# Patient Record
Sex: Female | Born: 1955 | Race: White | Hispanic: No | Marital: Married
Health system: Southern US, Community
[De-identification: ages and names within clinical notes are randomized; demographics above are authoritative.]

## PROBLEM LIST (undated history)

## (undated) DIAGNOSIS — K08409 Partial loss of teeth, unspecified cause, unspecified class: Secondary | ICD-10-CM

---

## 2006-06-12 ENCOUNTER — Other Ambulatory Visit: Admission: RE | Admit: 2006-06-12 | Discharge: 2006-06-12 | Payer: Self-pay | Admitting: *Deleted

## 2007-06-23 ENCOUNTER — Other Ambulatory Visit: Admission: RE | Admit: 2007-06-23 | Discharge: 2007-06-23 | Payer: Self-pay | Admitting: *Deleted

## 2008-06-11 ENCOUNTER — Encounter: Payer: Self-pay | Admitting: Pulmonary Disease

## 2008-06-21 ENCOUNTER — Ambulatory Visit: Payer: Self-pay | Admitting: Pulmonary Disease

## 2008-06-21 DIAGNOSIS — R05 Cough: Secondary | ICD-10-CM

## 2008-06-21 DIAGNOSIS — R059 Cough, unspecified: Secondary | ICD-10-CM | POA: Insufficient documentation

## 2008-06-21 DIAGNOSIS — R498 Other voice and resonance disorders: Secondary | ICD-10-CM

## 2008-06-22 ENCOUNTER — Other Ambulatory Visit: Admission: RE | Admit: 2008-06-22 | Discharge: 2008-06-22 | Payer: Self-pay | Admitting: Gynecology

## 2008-07-09 ENCOUNTER — Ambulatory Visit: Payer: Self-pay | Admitting: Pulmonary Disease

## 2009-01-18 ENCOUNTER — Ambulatory Visit: Payer: Self-pay | Admitting: Licensed Clinical Social Worker

## 2009-01-25 ENCOUNTER — Ambulatory Visit: Payer: Self-pay | Admitting: Licensed Clinical Social Worker

## 2009-02-01 ENCOUNTER — Ambulatory Visit: Payer: Self-pay | Admitting: Licensed Clinical Social Worker

## 2009-02-07 ENCOUNTER — Ambulatory Visit: Payer: Self-pay | Admitting: Licensed Clinical Social Worker

## 2009-02-10 ENCOUNTER — Ambulatory Visit: Payer: Self-pay | Admitting: Licensed Clinical Social Worker

## 2009-02-14 ENCOUNTER — Ambulatory Visit: Payer: Self-pay | Admitting: Licensed Clinical Social Worker

## 2009-03-07 ENCOUNTER — Ambulatory Visit: Payer: Self-pay | Admitting: Licensed Clinical Social Worker

## 2009-04-11 ENCOUNTER — Ambulatory Visit: Payer: Self-pay | Admitting: Licensed Clinical Social Worker

## 2009-10-06 ENCOUNTER — Telehealth: Payer: Self-pay | Admitting: Pulmonary Disease

## 2009-10-14 ENCOUNTER — Ambulatory Visit: Payer: Self-pay | Admitting: Pulmonary Disease

## 2009-10-20 ENCOUNTER — Telehealth (INDEPENDENT_AMBULATORY_CARE_PROVIDER_SITE_OTHER): Payer: Self-pay | Admitting: *Deleted

## 2009-11-28 IMAGING — CT CT ABD-PELV W/O CM
3 of 8 series · 12 of 42 positions shown, 18 images · IV contrast (CONTRAST)
Comparison: NONE

CLINICAL DATA: Midabdominal pain.  Evaluate for diverticulitis. 

CT ABDOMEN AND PELVIS WITHOUT AND WITH INTRAVENOUS AND FOLLOWING 
ORAL  CONTRAST
TECHNIQUE: Multiple axial images were obtained from the diaphragm 
through the pelvis.

[Series 4: venous · axial · portal-venous · 0.77mm/px · z∈[+1201,+1486]mm · 5 of 87 slices shown, 10 images]
[im 15/87  soft-tissue]
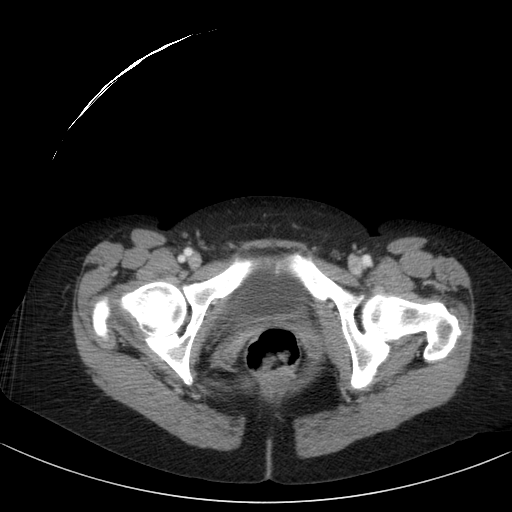
[im 15/87  bone]
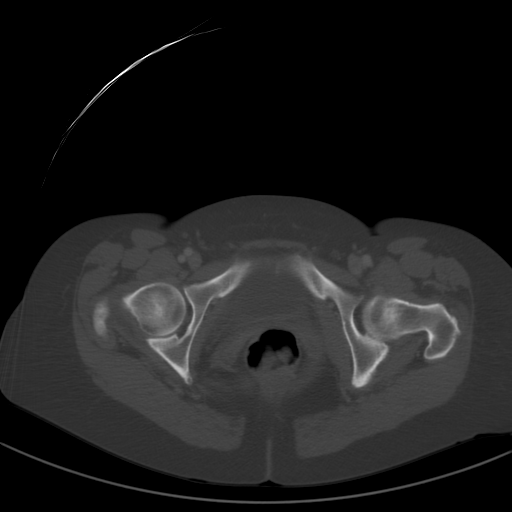
[im 29/87  soft-tissue]
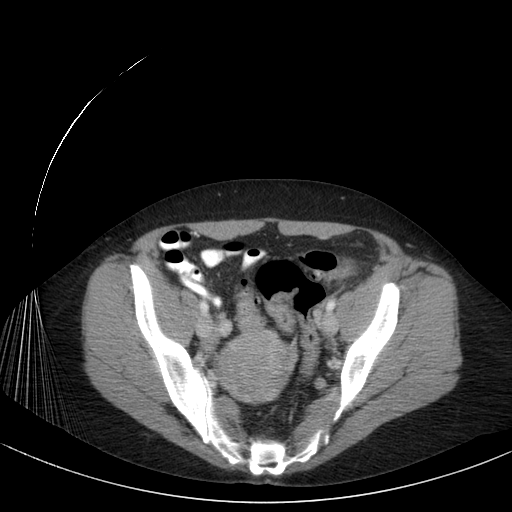
[im 29/87  lung]
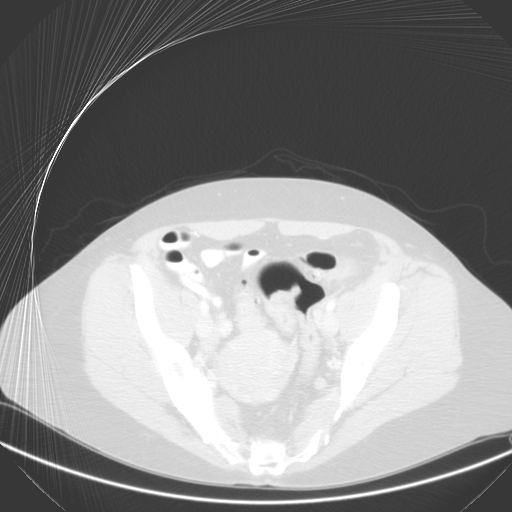
[im 44/87  soft-tissue]
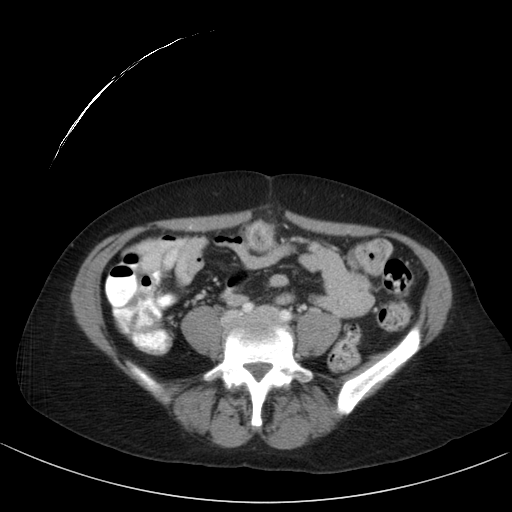
[im 44/87  lung]
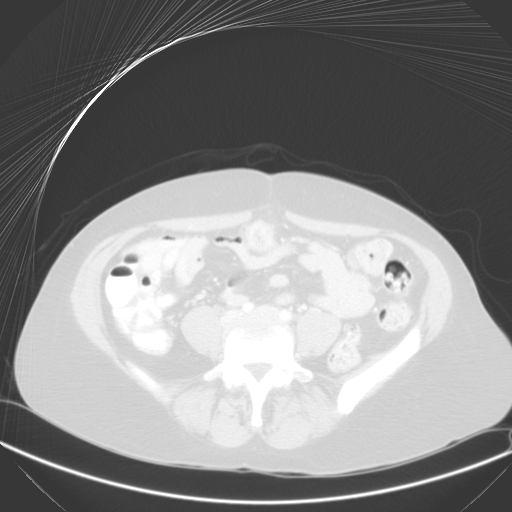
[im 58/87  soft-tissue]
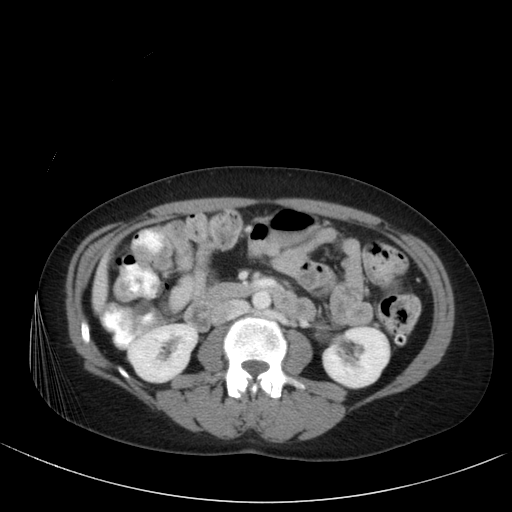
[im 58/87  lung]
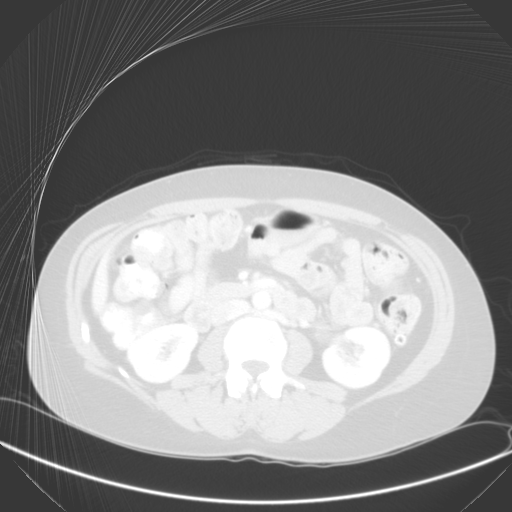
[im 72/87  soft-tissue]
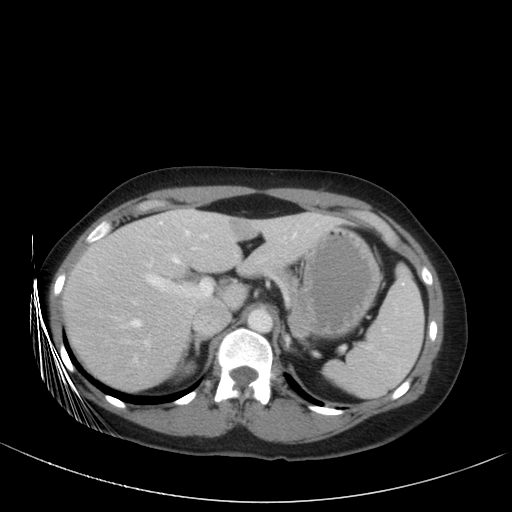
[im 72/87  lung]
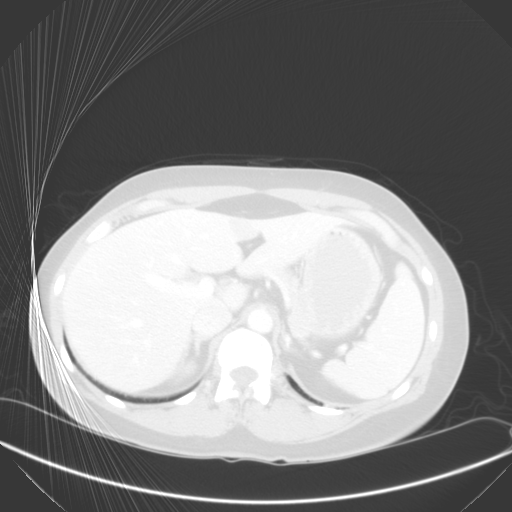

[Series 9: delays · axial · 0.75mm/px · z∈[+1247,+1452]mm · 4 of 69 slices shown]
[im 14/69  soft-tissue]
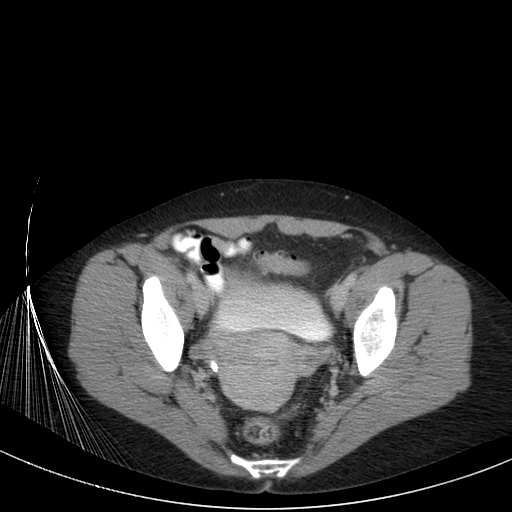
[im 28/69  soft-tissue]
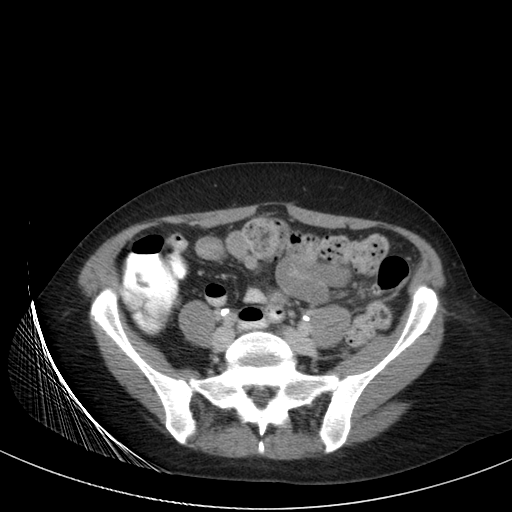
[im 41/69  soft-tissue]
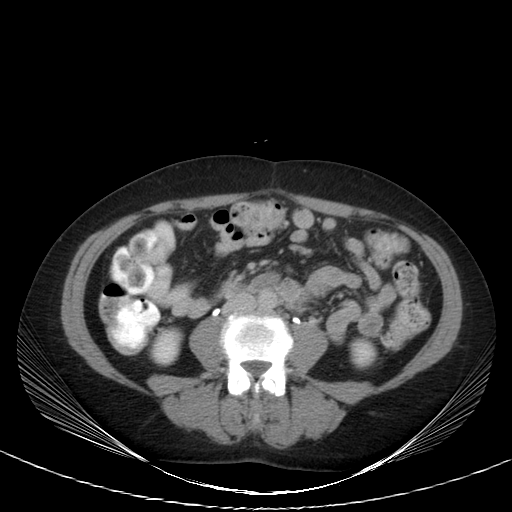
[im 55/69  soft-tissue]
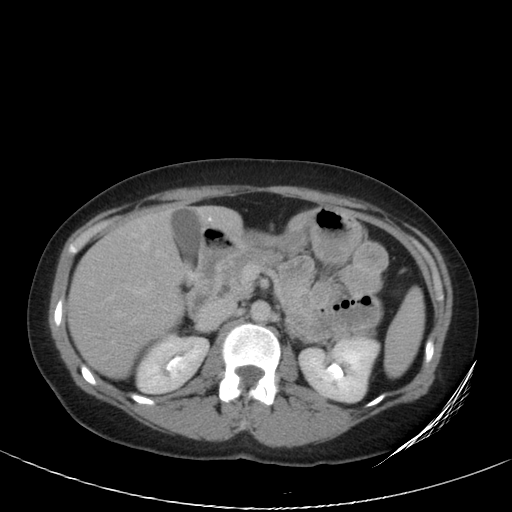

[coronals · coronal · 0.84mm/px · 3 of 71 slices shown, 4 images]
[im 24/71  soft-tissue]
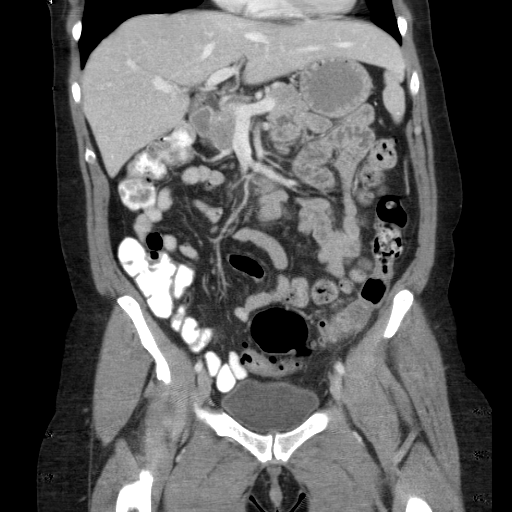
[im 32/71  soft-tissue]
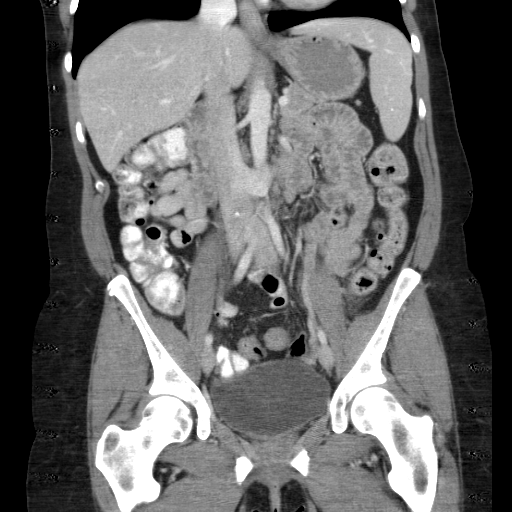
[im 32/71  bone]
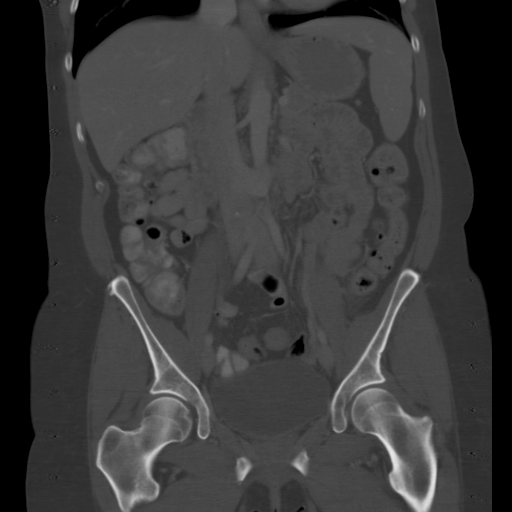
[im 39/71  soft-tissue]
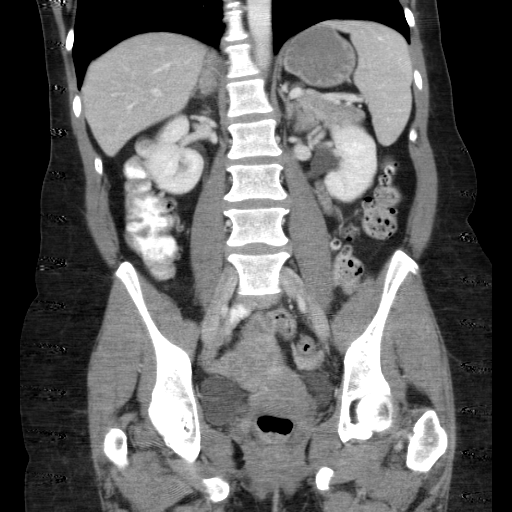

[12 of 42 positions shown; findings below may reference images not displayed]

FINDINGS: The liver, gallbladder, spleen and pancreas are 
unremarkable. There is no evidence of upper abdominal or 
retroperitoneal mass, adenopathy, or aneurysm. No adrenal or renal 
mass or obstruction is noted. No bowel, mesenteric, pelvic, or 
inguinal mass, adenopathy or inflammatory process. No evidence of 
appendicitis, diverticulitis, hernia or bowel obstruction. No lung 
base mass, infiltrate, edema or effusion. No lytic or blastic 
lesions are identified. No evidence of renal or ureteral calculi.
IMPRESSION: Negative CT of the abdomen and pelvis.  No evidence 
of mass, adenopathy or inflammatory process. Frantou, Karl-Sander Date:  05/18/2008 DAS  JLM

## 2010-06-16 ENCOUNTER — Encounter: Admission: RE | Admit: 2010-06-16 | Discharge: 2010-06-16 | Payer: Self-pay | Admitting: Family Medicine

## 2011-03-27 ENCOUNTER — Ambulatory Visit (INDEPENDENT_AMBULATORY_CARE_PROVIDER_SITE_OTHER): Payer: 59 | Admitting: Licensed Clinical Social Worker

## 2011-03-27 DIAGNOSIS — F4323 Adjustment disorder with mixed anxiety and depressed mood: Secondary | ICD-10-CM

## 2011-04-02 ENCOUNTER — Ambulatory Visit (INDEPENDENT_AMBULATORY_CARE_PROVIDER_SITE_OTHER): Payer: 59 | Admitting: Licensed Clinical Social Worker

## 2011-04-02 DIAGNOSIS — F4323 Adjustment disorder with mixed anxiety and depressed mood: Secondary | ICD-10-CM

## 2011-04-03 ENCOUNTER — Ambulatory Visit (INDEPENDENT_AMBULATORY_CARE_PROVIDER_SITE_OTHER): Payer: 59 | Admitting: Licensed Clinical Social Worker

## 2011-04-03 DIAGNOSIS — F4323 Adjustment disorder with mixed anxiety and depressed mood: Secondary | ICD-10-CM

## 2013-03-13 ENCOUNTER — Telehealth: Payer: Self-pay | Admitting: *Deleted

## 2013-03-13 NOTE — Telephone Encounter (Signed)
Chart opened in error, wrong patient.

## 2014-08-20 ENCOUNTER — Other Ambulatory Visit: Payer: Self-pay | Admitting: Gynecology

## 2014-08-20 DIAGNOSIS — R928 Other abnormal and inconclusive findings on diagnostic imaging of breast: Secondary | ICD-10-CM

## 2014-08-26 ENCOUNTER — Encounter (INDEPENDENT_AMBULATORY_CARE_PROVIDER_SITE_OTHER): Payer: Self-pay

## 2014-08-26 ENCOUNTER — Ambulatory Visit
Admission: RE | Admit: 2014-08-26 | Discharge: 2014-08-26 | Disposition: A | Discharge status: Home / Self Care | Payer: 59 | Source: Ambulatory Visit | Attending: Gynecology | Admitting: Gynecology

## 2014-08-26 DIAGNOSIS — R928 Other abnormal and inconclusive findings on diagnostic imaging of breast: Secondary | ICD-10-CM

## 2014-10-28 ENCOUNTER — Ambulatory Visit
Admission: RE | Admit: 2014-10-28 | Discharge: 2014-10-28 | Disposition: A | Discharge status: Home / Self Care | Payer: 59 | Source: Ambulatory Visit | Attending: Family Medicine | Admitting: Family Medicine

## 2014-10-28 ENCOUNTER — Other Ambulatory Visit: Payer: Self-pay | Admitting: Family Medicine

## 2014-10-28 DIAGNOSIS — M25572 Pain in left ankle and joints of left foot: Secondary | ICD-10-CM

## 2016-11-29 ENCOUNTER — Ambulatory Visit
Admission: RE | Admit: 2016-11-29 | Discharge: 2016-11-29 | Disposition: A | Discharge status: Home / Self Care | Payer: Commercial Managed Care - HMO | Source: Ambulatory Visit | Attending: Gastroenterology | Admitting: Gastroenterology

## 2016-11-29 ENCOUNTER — Other Ambulatory Visit: Payer: Self-pay | Admitting: Gastroenterology

## 2016-11-29 DIAGNOSIS — R52 Pain, unspecified: Secondary | ICD-10-CM

## 2017-03-21 ENCOUNTER — Emergency Department (HOSPITAL_COMMUNITY): Payer: Commercial Managed Care - HMO

## 2017-03-21 ENCOUNTER — Encounter (HOSPITAL_COMMUNITY): Payer: Self-pay | Admitting: Emergency Medicine

## 2017-03-21 ENCOUNTER — Other Ambulatory Visit: Payer: Self-pay | Admitting: Cardiology

## 2017-03-21 ENCOUNTER — Emergency Department (HOSPITAL_COMMUNITY)
Admission: EM | Admit: 2017-03-21 | Discharge: 2017-03-21 | Disposition: A | Discharge status: Home / Self Care | Payer: Commercial Managed Care - HMO | Attending: Emergency Medicine | Admitting: Emergency Medicine

## 2017-03-21 DIAGNOSIS — R0789 Other chest pain: Secondary | ICD-10-CM | POA: Diagnosis present

## 2017-03-21 DIAGNOSIS — R079 Chest pain, unspecified: Secondary | ICD-10-CM

## 2017-03-21 DIAGNOSIS — R072 Precordial pain: Secondary | ICD-10-CM

## 2017-03-21 HISTORY — DX: Partial loss of teeth, unspecified cause, unspecified class: K08.409

## 2017-03-21 LAB — BASIC METABOLIC PANEL
Anion gap: 8 (ref 5–15)
BUN: 8 mg/dL (ref 6–20)
CHLORIDE: 100 mmol/L — AB (ref 101–111)
CO2: 23 mmol/L (ref 22–32)
CREATININE: 0.68 mg/dL (ref 0.44–1.00)
Calcium: 9.5 mg/dL (ref 8.9–10.3)
GFR calc Af Amer: >60 mL/min (ref >60)
GFR calc non Af Amer: >60 mL/min (ref >60)
GLUCOSE: 109 mg/dL — AB (ref 65–99)
POTASSIUM: 4.1 mmol/L (ref 3.5–5.1)
SODIUM: 131 mmol/L — AB (ref 135–145)

## 2017-03-21 LAB — CBC
HEMATOCRIT: 41 % (ref 36.0–46.0)
Hemoglobin: 13.9 g/dL (ref 12.0–15.0)
MCH: 30.8 pg (ref 26.0–34.0)
MCHC: 33.9 g/dL (ref 30.0–36.0)
MCV: 90.7 fL (ref 78.0–100.0)
PLATELETS: 243 10*3/uL (ref 150–400)
RBC: 4.52 MIL/uL (ref 3.87–5.11)
RDW: 12.7 % (ref 11.5–15.5)
WBC: 5.7 10*3/uL (ref 4.0–10.5)

## 2017-03-21 LAB — I-STAT TROPONIN, ED
TROPONIN I, POC: 0 ng/mL (ref 0.00–0.08)
Troponin i, poc: 0 ng/mL (ref 0.00–0.08)

## 2017-03-21 NOTE — ED Provider Notes (Signed)
[facility]-EMERGENCY DEPT Provider Note   CSN: [id number] Arrival date & time: 03/21/17  7829     History   Chief Complaint Chief Complaint  Patient presents with  . Chest Pain    HPI Monica Watkins is a 61 y.o. female.  HPI Pt comes in with cc of chest pain. Pt has no medical hx or known CAD hx. She reports that she has been having intermittent L sided chest pain, described as tightness and squeezing below her L breast. The pain has been present off and on for few months, and there is no specific precipitating, aggravating or relieving factors with the pain. Pt does indicate L sided tingling sometimes with her pain. No associated dib, sweats, nausea.  Pt is moving to [place] next week and her mother was recently diagnosed with Alzheimer's -  So she has had increased stress recently, but pt denies any hx of anxiety / panic disorder.  Past Medical History:  Diagnosis Date  . Hx of tooth extraction     Patient Active Problem List   Diagnosis Date Noted  . HOARSENESS 06/21/2008  . COUGH 06/21/2008    History reviewed. No pertinent surgical history.  OB History    No data available       Home Medications    Prior to Admission medications   Not on File    Family History Family History  Problem Relation Age of Onset  . Alzheimer's disease Mother     Social History Social History  Substance Use Topics  . Smoking status: Not on file  . Smokeless tobacco: Not on file  . Alcohol use Not on file     Allergies   Patient has no allergy information on record.   Review of Systems Review of Systems  ROS 10 Systems reviewed and are negative for acute change except as noted in the HPI.     Physical Exam Updated Vital Signs BP 121/77 (BP Location: Right Arm)   Pulse 94   Temp 98.6 F (37 C)   Resp 18   SpO2 96%   Physical Exam  Constitutional: She is oriented to person, place, and time. She appears well-developed and well-nourished.  HENT:  Head: Normocephalic  and atraumatic.  Eyes: EOM are normal. Pupils are equal, round, and reactive to light.  Neck: Neck supple.  Cardiovascular: Normal rate, regular rhythm and normal heart sounds.   Pulmonary/Chest: Effort normal. No respiratory distress.  Abdominal: Soft. She exhibits no distension. There is no tenderness. There is no rebound and no guarding.  Neurological: She is alert and oriented to person, place, and time. No cranial nerve deficit.  Skin: Skin is warm and dry.  Nursing note and vitals reviewed.    ED Treatments / Results  Labs (all labs ordered are listed, but only abnormal results are displayed) Labs Reviewed  BASIC METABOLIC PANEL - Abnormal; Notable for the following:       Result Value   Sodium 131 (*)    Chloride 100 (*)    Glucose, Bld 109 (*)    All other components within normal limits  CBC  I-STAT TROPOININ, ED  I-STAT TROPOININ, ED    EKG  EKG Interpretation  Date/Time:  Thursday March 21 2017 09:44:31 EDT Ventricular Rate:  87 PR Interval:  148 QRS Duration: 64 QT Interval:  356 QTC Calculation: 428 R Axis:   69 Text Interpretation:  Normal sinus rhythm Normal ECG No acute changes No old tracing to compare Confirmed by Rilee Wendling,  MD, Janey Genta [phone]) on 03/21/2017 12:16:39 PM       Radiology Dg Chest 2 View  Result Date: 03/21/2017 CLINICAL DATA:  Chest pain. EXAM: CHEST  2 VIEW COMPARISON:  11/29/2016. FINDINGS: Mediastinum and hilar structures normal. Lungs are clear. No pleural effusion or pneumothorax. 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 Stable mild cardiomegaly. No acute bony abnormality . IMPRESSION: 1. Calcified pulmonary nodules again noted consistent with granulomas. No acute pulmonary disease. 2. Stable mild cardiomegaly. Electronically Signed   By: Maisie Fus  Register   On: 03/21/2017 10:26    Procedures Procedures (including critical care time)  Medications Ordered in ED Medications - No data to display   Initial  Impression / Assessment and Plan / ED Course  I have reviewed the triage vital signs and the nursing notes.  Pertinent labs & imaging results that were available during my care of the patient were reviewed by me and considered in my medical decision making (see chart for details).     Pt comes in with cc of chest pain. Chest pain is atypical - her HEART score is 2 - for hx and age. Pt's ekg is reassuring.  Pt is moving to [place], and will have no doctor or established care  Over there. I think it would be prudent for patient to get Cards eval with this atypical chest pain that was the worst it's been y'day and intermittent L sided tingling.  Cards called for outpatient f/u - but I was informed that it will be easier for them to staff the patient - and Dr. Jens Som has cleared pt for f/c with outpatient stress. Strict ER return precautions have been discussed, and patient is agreeing with the plan and is comfortable with the workup done and the recommendations from the ER.  Final Clinical Impressions(s) / ED Diagnoses   Final diagnoses:  Nonspecific chest pain  Precordial chest pain    New Prescriptions There are no discharge medications for this patient.    Derwood Kaplan, MD 03/21/17 [phone]

## 2017-03-21 NOTE — ED Notes (Signed)
Pt retuyrns to bed from [facility].

## 2017-03-21 NOTE — ED Notes (Signed)
Pt states she understands instructions. Home stable with with husband . Ambulatory with steady gait.

## 2017-03-21 NOTE — ED Notes (Signed)
ED Provider at bedside. 

## 2017-03-21 NOTE — ED Notes (Signed)
Pt speaking with PAat bedside

## 2017-03-21 NOTE — Discharge Instructions (Signed)
Please return to the ER if you have worsening chest pain, shortness of breath, pain radiating to your jaw, shoulder, or back, sweats or fainting. Otherwise see the Cardiologist or your primary care doctor as requested. ? ?

## 2017-03-21 NOTE — Consult Note (Signed)
Cardiology Consult    Patient ID: Monica Watkins MRN: [id number], DOB/AGE: [birth year]   Admit date: 03/21/2017 Date of Consult: 03/21/2017  Primary Physician: Sissy Hoff, MD Primary Cardiologist: New Requesting Provider: Rhunette Croft Reason for Consultation: Chest pain  Monica Watkins is a 61 y.o. female who is being seen today for the evaluation of chest pain at the request of Dr. Rhunette Croft.   Patient Profile    61 yo female with no PMH who presented to the ED with intermittent chest pain over the past couple of months.   Past Medical History   Past Medical History:  Diagnosis Date  . Hx of tooth extraction     History reviewed. No pertinent surgical history.   Allergies  Allergies not on file  History of Present Illness    Monica Watkins is a 61 yo female with no PMH. States she sees a PCP on a regular basis for physicals and always has good lab work. Never had any issues with HTN, HL, does not smoke. No family hx of CAD, only alzheimer's with her mother. Reports she occassionally has brief fleeting episode of chest pain that are not associated with exertion. Very active, cares for her mother but recently had to placed her in a memory care unit. Also in the process of moving to [place] with her husband within the next week. Currently working to pack her up her home, and move. States she had a very stressful day yesterday with the moving process and her mother. Last evening she went to bed around 8pm, was awoken from sleep around 10pm with chest pain, felt like a "squeezing" around her heart. This lasted around a hour or so, and was more intense than any other episodes she's had. This morning she told her husband about this episode and felt that she needed to come in to be checked out.   In the ED her EKG showed SR with no acute findings. Trop negx2, CXR neg, Na+ 131. No further chest pain while in the ED. Cardiology was called for consultation   Inpatient Medications      Family  History    Family History  Problem Relation Age of Onset  . Alzheimer's disease Mother     Social History    Social History   Social History  . Marital status: Married    Spouse name: N/A  . Number of children: N/A  . Years of education: N/A   Occupational History  . Not on file.   Social History Main Topics  . Smoking status: Not on file  . Smokeless tobacco: Not on file  . Alcohol use Not on file  . Drug use: Unknown  . Sexual activity: Not on file   Other Topics Concern  . Not on file   Social History Narrative  . No narrative on file     Review of Systems    General:  No chills, fever, night sweats or weight changes.  Cardiovascular:  See HPI Dermatological: No rash, lesions/masses Respiratory: No cough, dyspnea Urologic: No hematuria, dysuria Abdominal:   No nausea, vomiting, diarrhea, bright red blood per rectum, melena, or hematemesis Neurologic:  No visual changes, wkns, changes in mental status. All other systems reviewed and are otherwise negative except as noted above.  Physical Exam    Blood pressure 128/77, pulse (!) 39, temperature 98.1 F (36.7 C), temperature source Oral, resp. rate 14, SpO2 (!) 87 %.  General: Pleasant older WF, NAD Psych: Normal affect. Neuro:  Alert and oriented X 3. Moves all extremities spontaneously. HEENT: Normal  Neck: Supple without bruits or JVD. Lungs:  Resp regular and unlabored, CTA. Heart: RRR no s3, s4, or murmurs. Abdomen: Soft, non-tender, non-distended, BS + x 4.  Extremities: No clubbing, cyanosis or edema. DP/PT/Radials 2+ and equal bilaterally.  Labs    Troponin [facility] of Care Test)  Recent Labs  03/21/17 1349  TROPIPOC 0.00   No results for input(s): CKTOTAL, CKMB, TROPONINI in the last 72 hours. Lab Results  Component Value Date   WBC 5.7 03/21/2017   HGB 13.9 03/21/2017   HCT 41.0 03/21/2017   MCV 90.7 03/21/2017   PLT 243 03/21/2017     Recent Labs Lab 03/21/17 0959  NA 131*  K  4.1  CL 100*  CO2 23  BUN 8  CREATININE 0.68  CALCIUM 9.5  GLUCOSE 109*   No results found for: CHOL, HDL, LDLCALC, TRIG No results found for: [facility]   Radiology Studies    Dg Chest 2 View  Result Date: 03/21/2017 CLINICAL DATA:  Chest pain. EXAM: CHEST  2 VIEW COMPARISON:  11/29/2016. FINDINGS: Mediastinum and hilar structures normal. Lungs are clear. No pleural effusion or pneumothorax. 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 Stable mild cardiomegaly. No acute bony abnormality . IMPRESSION: 1. Calcified pulmonary nodules again noted consistent with granulomas. No acute pulmonary disease. 2. Stable mild cardiomegaly. Electronically Signed   By: Maisie Fus  Register   On: 03/21/2017 10:26    ECG & Cardiac Imaging    EKG: SR with no acute ST/T wave changes   Assessment & Plan    61 yo female with no PMH who presented to the ED with intermittent chest pain over the past couple of months.  Chest pain: Reports intermittent episodes over the past couple of months, but has also been under increased amount of stress with moving and caring for her mother. Had an episode squeezing chest pain that woke her from sleep night. Trop neg x2, EKG showed SR no ST/T wave changes. No chest pain in the ED. She has ruled out. No reported family hx of CAD.  -- will plan for outpatient exercise myoview  Signed, Laverda Page, NP-C Pager [phone] 03/21/2017, 2:13 PM As above, patient seen and examined. Briefly she is a 61 year old female with no significant past medical history who Dr. Rhunette Croft has asked me to consult on concerning chest pain. Patient has had occasional chest pain for 2 years. It occurs both with exertion and at rest. It is under the left breast and described as a squeezing sensation. No radiation. Not pleuritic, positional, exertional or related to food. Typically lasts 10 minutes and resolves. She has been under a significant amount of stress as her mother  has Alzheimer's and she is moving to [place]. Last evening she had more prolonged episode beginning at approximately 10 PM lasting one hour. She has had no chest pain since early a.m. hours. No associated symptoms. Cardiology asked to evaluate.  Electrocardiogram shows sinus rhythm with no ST changes. Enzymes are negative.   Chest pain-Symptoms are atypical. Her chest pain was greater then 12 hours ago and her enzymes are negative and electrocardiogram without ST changes. She has essentially ruled out. We will arrange an outpatient exercise treadmill hopefully tomorrow as she is moving to [place] next week. She may be discharged from a cardiac standpoint.  Olga Millers, MD

## 2017-03-21 NOTE — ED Triage Notes (Signed)
Pt states she has been having intermittent chest pains since yesterday that has been on the left side that feels like a sharp pressure pain. Pt reports recent history of stress her mother being placed in nursing home and moving.

## 2017-03-22 ENCOUNTER — Ambulatory Visit (HOSPITAL_COMMUNITY): Payer: Commercial Managed Care - HMO | Attending: Internal Medicine

## 2017-03-22 DIAGNOSIS — R079 Chest pain, unspecified: Secondary | ICD-10-CM | POA: Diagnosis not present

## 2017-03-22 LAB — MYOCARDIAL PERFUSION IMAGING
CHL CUP NUCLEAR SDS: 4
CHL CUP NUCLEAR SRS: 1
CHL CUP NUCLEAR SSS: 5
CSEPED: 8 min
CSEPEDS: 30 s
CSEPEW: 10.1 METS
CSEPHR: 89 %
CSEPPHR: 142 {beats}/min
LV dias vol: 71 mL (ref 46–106)
LVSYSVOL: 22 mL
MPHR: 158 {beats}/min
RATE: 0.32
Rest HR: 75 {beats}/min
TID: 0.88

## 2017-03-22 MED ORDER — TECHNETIUM TC 99M TETROFOSMIN IV KIT
10.9000 | PACK | Freq: Once | INTRAVENOUS | Status: AC | PRN
  Administered 2017-03-22: 10.9 via INTRAVENOUS
  Filled 2017-03-22: qty 11

## 2017-03-22 MED ORDER — TECHNETIUM TC 99M TETROFOSMIN IV KIT
32.3000 | PACK | Freq: Once | INTRAVENOUS | Status: AC | PRN
  Administered 2017-03-22: 32.3 via INTRAVENOUS
  Filled 2017-03-22: qty 33

## 2017-03-25 ENCOUNTER — Telehealth: Payer: Self-pay | Admitting: Cardiology

## 2017-03-25 NOTE — Telephone Encounter (Signed)
New Message     Pt returning call for stress test results , missed [facility] call

## 2017-03-25 NOTE — Telephone Encounter (Signed)
Returned call, advised pt on reassuring stress test findings - she voiced understanding and thanks.  She is moving to [place] next week - will she need to f/u w cards when she gets there? She is very concerned about the chest pain she experienced the other day and not completely relieved by the normal findings of stress test. Informed me that after today she may be reachable by her mobile number only.

## 2017-04-03 NOTE — Telephone Encounter (Signed)
Do not think she needs cardiologist; will need primary care Olga Millers

## 2017-04-03 NOTE — Telephone Encounter (Signed)
Called patient and advised her that per Dr. Jens Som she may follow-up with a PCP once she reaches [place]. She states she has arrived in [place] and thanked me for the call.
